# Patient Record
Sex: Male | Born: 1965 | Race: White | Hispanic: No | Marital: Married | Smoking: Never smoker
Health system: Southern US, Community
[De-identification: ages and names within clinical notes are randomized; demographics above are authoritative.]

---

## 2017-05-28 ENCOUNTER — Emergency Department (HOSPITAL_COMMUNITY)
Admission: EM | Admit: 2017-05-28 | Discharge: 2017-05-28 | Disposition: A | Discharge status: Home / Self Care | Payer: Self-pay | Attending: Emergency Medicine | Admitting: Emergency Medicine

## 2017-05-28 ENCOUNTER — Emergency Department (HOSPITAL_COMMUNITY): Payer: Self-pay

## 2017-05-28 ENCOUNTER — Encounter (HOSPITAL_COMMUNITY): Payer: Self-pay | Admitting: Emergency Medicine

## 2017-05-28 DIAGNOSIS — Y929 Unspecified place or not applicable: Secondary | ICD-10-CM | POA: Insufficient documentation

## 2017-05-28 DIAGNOSIS — Y939 Activity, unspecified: Secondary | ICD-10-CM | POA: Insufficient documentation

## 2017-05-28 DIAGNOSIS — Y998 Other external cause status: Secondary | ICD-10-CM | POA: Insufficient documentation

## 2017-05-28 DIAGNOSIS — W1789XA Other fall from one level to another, initial encounter: Secondary | ICD-10-CM | POA: Insufficient documentation

## 2017-05-28 DIAGNOSIS — Z23 Encounter for immunization: Secondary | ICD-10-CM | POA: Insufficient documentation

## 2017-05-28 DIAGNOSIS — S0101XA Laceration without foreign body of scalp, initial encounter: Secondary | ICD-10-CM | POA: Insufficient documentation

## 2017-05-28 DIAGNOSIS — S42114A Nondisplaced fracture of body of scapula, right shoulder, initial encounter for closed fracture: Secondary | ICD-10-CM | POA: Insufficient documentation

## 2017-05-28 MED ORDER — IOPAMIDOL (ISOVUE-300) INJECTION 61%
INTRAVENOUS | Status: AC
  Administered 2017-05-28: 75 mL via INTRAVENOUS
  Filled 2017-05-28: qty 75

## 2017-05-28 MED ORDER — TETANUS-DIPHTH-ACELL PERTUSSIS 5-2.5-18.5 LF-MCG/0.5 IM SUSP
0.5000 mL | Freq: Once | INTRAMUSCULAR | Status: AC
  Administered 2017-05-28: 0.5 mL via INTRAMUSCULAR
  Filled 2017-05-28: qty 0.5

## 2017-05-28 MED ORDER — OXYCODONE-ACETAMINOPHEN 5-325 MG PO TABS: 1.0000 | ORAL_TABLET | Freq: Four times a day (QID) | ORAL | 0 refills | Status: DC | PRN

## 2017-05-28 MED ORDER — SODIUM CHLORIDE 0.9 % IV SOLN: Freq: Once | INTRAVENOUS | Status: DC

## 2017-05-28 MED ORDER — MORPHINE SULFATE (PF) 4 MG/ML IV SOLN: 4.0000 mg | Freq: Once | INTRAVENOUS | Status: DC | PRN

## 2017-05-28 NOTE — ED Notes (Signed)
Pt given warm wet cloths to wash his face and side of head.

## 2017-05-28 NOTE — Progress Notes (Signed)
Orthopedic Tech Progress Note Patient Details:  Hunter Beck 23-Mar-1966 [id number]  Ortho Devices Type of Ortho Device: Shoulder immobilizer Ortho Device/Splint Location: RUE Ortho Device/Splint Interventions: Ordered, Application   Jennye MoccasinHughes, Hunter Beck 05/28/2017, 6:11 PM

## 2017-05-28 NOTE — ED Provider Notes (Signed)
[facility]-EMERGENCY DEPT Provider Note   CSN: [id number] Arrival date & time: 05/28/17  1430     History   Chief Complaint Chief Complaint  Patient presents with  . Fall  . Head Injury    HPI Hunter Beck is a 51 y.o. male.  HPI   51 year old male presenting after fall. He fell from the back of a truck approximately 3 feet from the ground. He lost his balance. He fell backwards and onto his right side. He did strike his head. Is unsure if he lost consciousness. Complaining of a headache and sustained scalp laceration. He is also having significant right posterior shoulder pain. Denies any neck or back pain. He is not anticoagulated. Denies any acute numbness, tingling or focal loss of strength. Accident happened shortly before arrival. He is unsure of his last tetanus shot. He is currently declining pain medication.  History reviewed. No pertinent past medical history.  There are no active problems to display for this patient.   History reviewed. No pertinent surgical history.     Home Medications    Prior to Admission medications   Not on File    Family History No family history on file.  Social History Social History  Substance Use Topics  . Smoking status: Never Smoker  . Smokeless tobacco: Never Used  . Alcohol use Yes     Comment: occasionally     Allergies   Patient has no known allergies.   Review of Systems Review of Systems  All systems reviewed and negative, other than as noted in HPI.  Physical Exam Updated Vital Signs BP (!) 164/109   Pulse 74   Temp (!) 97.2 F (36.2 C) (Temporal)   Resp 16   Ht  (1.702 m)   Wt 83.9 kg (185 lb)   SpO2 95%   BMI 28.98 kg/m   Physical Exam  Constitutional: He is oriented to person, place, and time. He appears well-developed and well-nourished. No distress.  HENT:  Head: Normocephalic.  Two R posterior scalp lacerations. 2.5 cm and 4 cm. no active bleeding.   Eyes: Pupils are equal, round, and  reactive to light. Conjunctivae and EOM are normal. Right eye exhibits no discharge. Left eye exhibits no discharge.  Neck: Neck supple.  Cardiovascular: Normal rate, regular rhythm and normal heart sounds.  Exam reveals no gallop and no friction rub.   No murmur heard. Pulmonary/Chest: Effort normal and breath sounds normal. No respiratory distress.  Abdominal: Soft. He exhibits no distension. There is no tenderness.  Musculoskeletal: He exhibits no edema or tenderness.  Significant R posterior shoulder/scapular tenderness. Mild soft tissue swelling. Internal and external rotation of shoulder w/o pain. Pain posteriorly with abduction. No midline spinal tenderness  Neurological: He is alert and oriented to person, place, and time. No cranial nerve deficit or sensory deficit. He exhibits normal muscle tone. Coordination normal.  Skin: Skin is warm and dry.  Psychiatric: He has a normal mood and affect. His behavior is normal. Thought content normal.  Nursing note and vitals reviewed.    ED Treatments / Results  Labs (all labs ordered are listed, but only abnormal results are displayed) Labs Reviewed - No data to display  EKG  EKG Interpretation None       Radiology Ct Head Wo Contrast  Result Date: 05/28/2017 CLINICAL DATA:  Larey Seat off the back of a truck. EXAM: CT HEAD WITHOUT CONTRAST CT CERVICAL SPINE WITHOUT CONTRAST TECHNIQUE: Multidetector CT imaging of the head and cervical  spine was performed following the standard protocol without intravenous contrast. Multiplanar CT image reconstructions of the cervical spine were also generated. COMPARISON:  None. FINDINGS: CT HEAD FINDINGS Brain: No evidence of acute infarction, hemorrhage, hydrocephalus, extra-axial collection or mass lesion/mass effect. Mild age related cerebral atrophy with prominent ex vacuo dilatation of the ventricles, greater than expected for age. Vascular: No hyperdense vessel or unexpected calcification. Skull:  Normal. Negative for fracture or focal lesion. Sinuses/Orbits: The bilateral paranasal sinuses and mastoid air cells are clear. The orbits are unremarkable. Other: Small right parietal scalp hematoma with overlying skin staples. CT CERVICAL SPINE FINDINGS Alignment: Normal. Skull base and vertebrae: No acute fracture. No primary bone lesion or focal pathologic process. Soft tissues and spinal canal: No prevertebral fluid or swelling. No visible canal hematoma. Disc levels: Multilevel disc height loss, with prominent disc bulges at C4-C5, C5-C6, and C6-C7. Moderate left facet uncovertebral hypertrophy at C3-C4 with resultant moderate neuroforaminal stenosis. Moderate left facet arthropathy at C4-C5. Moderate disc disease and uncovertebral hypertrophy at C5-C6 resulting in moderate bilateral neuroforaminal stenosis. Upper chest: Negative. Other: None. IMPRESSION: 1. No acute intracranial abnormality. Small right parietal scalp hematoma. 2.  No acute cervical spine fracture. 3. 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 Electronically Signed   By: Obie DredgeWilliam T Derry M.D.   On: 05/28/2017 16:19   Ct Chest W Contrast  Addendum Date: 05/28/2017   ADDENDUM REPORT: 05/28/2017 16:50 ADDENDUM: After discussion with Dr. Juleen ChinaKohut regarding the exact site of the patient's pain, and upon further review of the CT images, there is a nondisplaced fracture of the scapular body. No associated hematoma or overlying soft tissue stranding. Electronically Signed   By: Leanna BattlesMelinda  Blietz M.D.   On: 05/28/2017 16:50   Result Date: 05/28/2017 CLINICAL DATA:  Fall, initial encounter. EXAM: CT CHEST WITH CONTRAST TECHNIQUE: Multidetector CT imaging of the chest was performed during intravenous contrast administration. CONTRAST:  75mL ISOVUE-300 IOPAMIDOL (ISOVUE-300) INJECTION 61% COMPARISON:  None. FINDINGS: Cardiovascular: Vascular structures are unremarkable. Heart size normal. No pericardial  effusion. Mediastinum/Nodes: No pathologically enlarged mediastinal, hilar or axillary lymph nodes. Esophagus is grossly unremarkable. Lungs/Pleura: Mild dependent atelectasis bilaterally. Lungs are otherwise clear. No pleural fluid. Airway is unremarkable. Upper Abdomen: Visualized portions of the liver, gallbladder, adrenal glands, kidneys, spleen, pancreas, stomach and bowel are grossly unremarkable. No upper abdominal adenopathy. Musculoskeletal: No fracture. 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 Dextroconvex scoliosis centered in the midthoracic spine. IMPRESSION: No evidence of acute trauma. Electronically Signed: By: Leanna BattlesMelinda  Blietz M.D. On: 05/28/2017 16:13   Ct Cervical Spine Wo Contrast  Result Date: 05/28/2017 CLINICAL DATA:  Larey SeatFell off the back of a truck. EXAM: CT HEAD WITHOUT CONTRAST CT CERVICAL SPINE WITHOUT CONTRAST TECHNIQUE: Multidetector CT imaging of the head and cervical spine was performed following the standard protocol without intravenous contrast. Multiplanar CT image reconstructions of the cervical spine were also generated. COMPARISON:  None. FINDINGS: CT HEAD FINDINGS Brain: No evidence of acute infarction, hemorrhage, hydrocephalus, extra-axial collection or mass lesion/mass effect. Mild age related cerebral atrophy with prominent ex vacuo dilatation of the ventricles, greater than expected for age. Vascular: No hyperdense vessel or unexpected calcification. Skull: Normal. Negative for fracture or focal lesion. Sinuses/Orbits: The bilateral paranasal sinuses and mastoid air cells are clear. The orbits are unremarkable. Other: Small right parietal scalp hematoma with overlying skin staples. CT CERVICAL SPINE FINDINGS Alignment: Normal. Skull base and vertebrae: No acute fracture. No primary bone lesion or focal pathologic process. Soft tissues  and spinal canal: No prevertebral fluid or swelling. No visible canal hematoma. Disc levels: Multilevel disc height loss, with prominent disc bulges at  C4-C5, C5-C6, and C6-C7. Moderate left facet uncovertebral hypertrophy at C3-C4 with resultant moderate neuroforaminal stenosis. Moderate left facet arthropathy at C4-C5. Moderate disc disease and uncovertebral hypertrophy at C5-C6 resulting in moderate bilateral neuroforaminal stenosis. Upper chest: Negative. Other: None. IMPRESSION: 1. No acute intracranial abnormality. Small right parietal scalp hematoma. 2.  No acute cervical spine fracture. 3. 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 Electronically Signed   By: Obie Dredge M.D.   On: 05/28/2017 16:19    Procedures Procedures (including critical care time)  LACERATION REPAIR Performed by: Raeford Razor Authorized by: Raeford Razor Consent: Verbal consent obtained. Risks and benefits: risks, benefits and alternatives were discussed Consent given by: patient Patient identity confirmed: provided demographic data Prepped and Draped in normal sterile fashion Wound explored  Laceration Location: scalp  Laceration Length: 2.5 cm  No Foreign Bodies seen or palpated  Anesthesia: none  Irrigation method: syringe Amount of cleaning: standard  Skin closure: stapled  Number of staples: 2  Patient tolerance: Patient tolerated the procedure well with no immediate complications.  LACERATION REPAIR Performed by: Raeford Razor Authorized by: Raeford Razor Consent: Verbal consent obtained. Risks and benefits: risks, benefits and alternatives were discussed Consent given by: patient Patient identity confirmed: provided demographic data Prepped and Draped in normal sterile fashion Wound explored  Laceration Location: scalp  Laceration Length: 4 cm  No Foreign Bodies seen or palpated  Anesthesia: none  Irrigation method: syringe Amount of cleaning: standard  Skin closure: stapled  Number of staples: 6  Patient tolerance: Patient tolerated the procedure well with no  immediate complications.  Medications Ordered in ED Medications  Tdap (BOOSTRIX) injection 0.5 mL (not administered)  0.9 %  sodium chloride infusion (not administered)  morphine 4 MG/ML injection 4 mg (not administered)  iopamidol (ISOVUE-300) 61 % injection (75 mLs Intravenous Contrast Given 05/28/17 1544)     Initial Impression / Assessment and Plan / ED Course  I have reviewed the triage vital signs and the nursing notes.  Pertinent labs & imaging results that were available during my care of the patient were reviewed by me and considered in my medical decision making (see chart for details).     51 year old male presenting after fall. 2 posterior scalp lacerations which were stapled. Neurologically he is intact. He is not anticoagulated. He is not complaining of any neck pain nor does have midline spinal tenderness but I am concerned for possible distracting injury. He does has significant right posterior shoulder/scapular tenderness. The glenohumeral joint and clavicle seem fine. Will CT his head and cervical spine. I am concerned for scapular injury which is typically associated with other significant traumatic injuries so will also CT chest.   Possible nondisplaced R scapular fracture. Subtle radiographically if actually real.  He is exquisitely tender in this area though. Will place in sling. Ortho FU. PRN pain meds. Wound care. Staple removal in 7-10 days.    Final Clinical Impressions(s) / ED Diagnoses   Final diagnoses:  Laceration of scalp, initial encounter  Closed nondisplaced fracture of body of right scapula, initial encounter    New Prescriptions New Prescriptions   No medications on file     Raeford Razor, MD 06/15/17 1218

## 2017-06-07 ENCOUNTER — Encounter: Payer: Self-pay | Admitting: Family Medicine

## 2017-06-07 ENCOUNTER — Ambulatory Visit (INDEPENDENT_AMBULATORY_CARE_PROVIDER_SITE_OTHER): Payer: Worker's Compensation | Admitting: Family Medicine

## 2017-06-07 VITALS — BP 137/88 | HR 65 | Temp 97.6°F | Ht 67.0 in | Wt 181.0 lb

## 2017-06-07 DIAGNOSIS — S0101XA Laceration without foreign body of scalp, initial encounter: Secondary | ICD-10-CM

## 2017-06-07 DIAGNOSIS — W19XXXD Unspecified fall, subsequent encounter: Secondary | ICD-10-CM

## 2017-06-07 DIAGNOSIS — Z4802 Encounter for removal of sutures: Secondary | ICD-10-CM

## 2017-06-07 NOTE — Progress Notes (Signed)
   Subjective: CC: Work injury Date of injury: 05/28/2017 Employer: [employer] PCP: Janora Norlander, DO Hunter Beck is a 51 y.o. male presenting to clinic today for:  1. Fall Patient seen in the emergency department on 05/28/2017 for a fall from 3 feet high. He did sustain injury to his head and was unsure loss of consciousness. He had a CT scan of his head which was negative for acute fractures or intracranial bleed. Additionally, he was complaining of right shoulder/posterior shoulder pain. CT chest showed what looked to be a subtle nondisplaced fracture of the right scapula. He was referred to orthopedics for further evaluation of this. For his scalp lacerations, he had 8 total staples placed, which he reportedly tolerated well. He also had his tetanus shot booster administered. He was discharged with a small supply of Percocet for pain. He notes since emergency department visit, he has been doing well.  He notes has has no more right shoulder pain.  He has full use of shoulder.  Did not see orthopedics. no Purulence, bleeding, swelling, fevers, nausea, vomiting, redness. No Dizziness, blurred vision, slurred speech, weakness, numbness or tingling.  No Known Allergies History reviewed. No pertinent past medical history. Family History  Problem Relation Age of Onset  . Cancer Mother   . Arthritis Mother    Social Hx: non smoker.Current medications reviewed.   ROS: Per HPI  Objective: Office vital signs reviewed. BP 137/88   Pulse 65   Temp 97.6 F (36.4 C) (Oral)   Ht '5\' 7"'$  (1.702 m)   Wt 181 lb (82.1 kg)   BMI 28.35 kg/m   Physical Examination:  General: Awake, alert, well nourished, well appearing, No acute distress HEENT: EOMI, PERRL, FROM of c-spine. Scalp with 2 lacerations and a total of 8 staples identified.  Some scab formation present.  No drainage, purulence, erythema, bleeding or TTP. Extremities: Has full, painless active range of motion of bilateral  upper extremities. Extremities are warm, well perfused, No edema, cyanosis or clubbing; +2 pulses bilaterally  PROCEDURE: Verbal consent was obtained from patient prior to procedure. Area was cleaned with rubbing alcohol. Staples were identified, and removed using a staple removal kit. No bleeding. No immediate consultations. Patient tolerated procedure well. Home care instructions were reviewed with patient.  Assessment/ Plan: 51 y.o. male   1. Laceration of scalp without foreign body, initial encounter No evidence of infection, bleeding.  2. Encounter for staple removal See procedure note above. Tolerated procedure well. Home care charges were reviewed including avoidance of swimming pools/ lakes for the next couple of days. He may continue to bathe normally. Resume normal activities.  3. Fall, subsequent encounter Patient did not seek treatment with the orthopedist for suspected right scapular fracture. He notes complete resolution of symptoms and therefore does not desire to pursue evaluation. He has been working unrestricted since fall. He may continue activities as tolerated. I did discuss that if he does have a true fracture, he risks displacing the fracture which would require surgical repair. He was good understanding of this and except these risks. Return precautions discussed. He will follow up as needed. No work restrictions placed.   Janora Norlander, DO [place] [phone]

## 2017-06-07 NOTE — Patient Instructions (Signed)
Make sure to see the orthopedist as recommended for your fracture of your shoulder blade.

## 2018-09-17 IMAGING — CT CT CHEST W/ CM
2 of 4 series · 15 of 36 positions shown, 18 images · IV contrast (Omni 300)
Comparison: None.

ADDENDUM:
After discussion with Dr. Leftheris regarding the exact site of the
patient's pain, and upon further review of the CT images, there is a
nondisplaced fracture of the scapular body. No associated hematoma
or overlying soft tissue stranding.
CLINICAL DATA: Fall, initial encounter.

EXAM:
CT CHEST WITH CONTRAST
TECHNIQUE: Multidetector CT imaging of the chest was performed during
intravenous contrast administration.
CONTRAST:  75mL NEHZQJ-T99 IOPAMIDOL (NEHZQJ-T99) INJECTION 61%

[Series 3: chest with 2mm st · axial · 0.88mm/px · z∈[-522,-260]mm · 12 of 153 slices shown, 15 images]
[im 11/153  mediastinal]
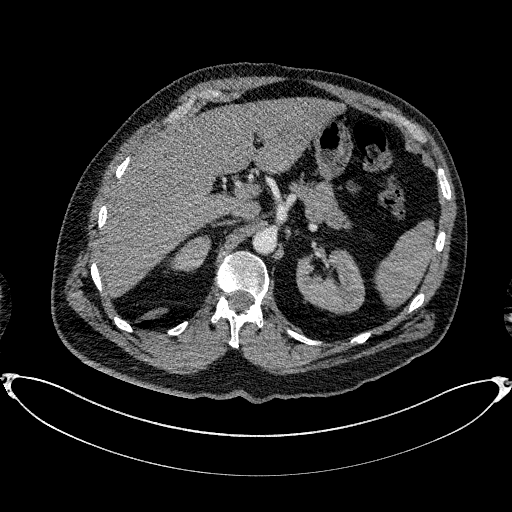
[im 11/153  lung]
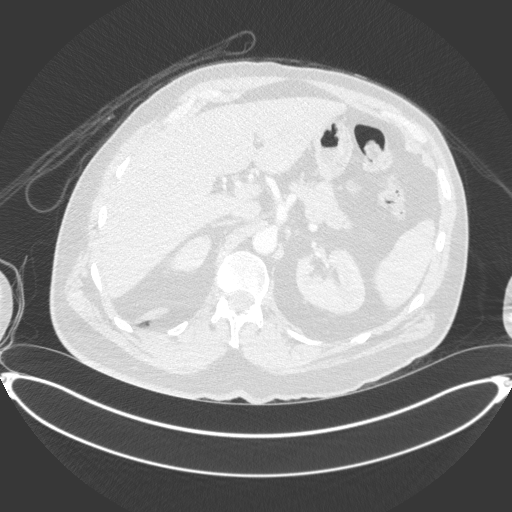
[im 22/153  lung]
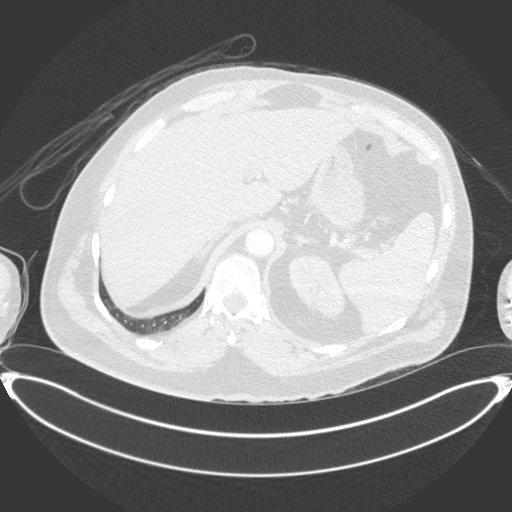
[im 33/153  lung]
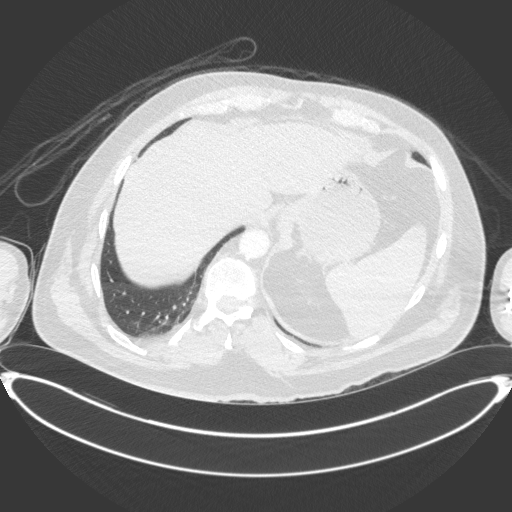
[im 44/153  lung]
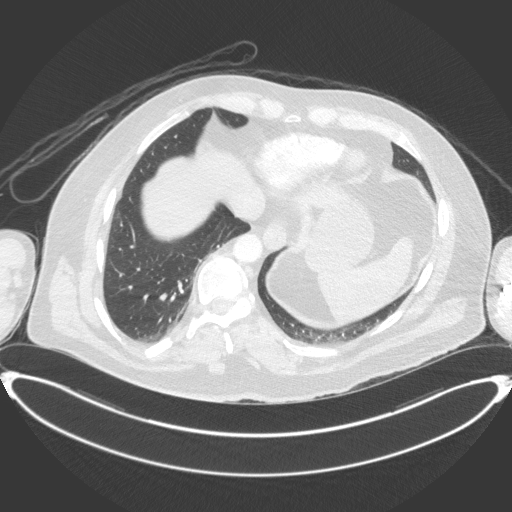
[im 55/153  mediastinal]
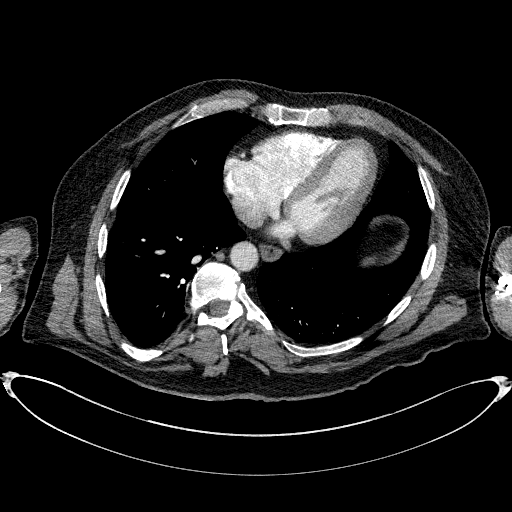
[im 55/153  lung]
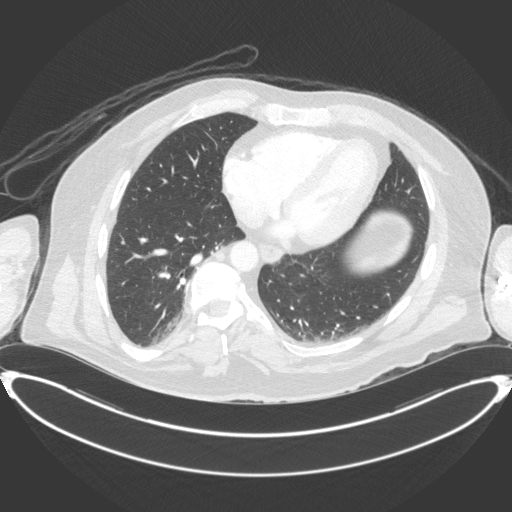
[im 66/153  lung]
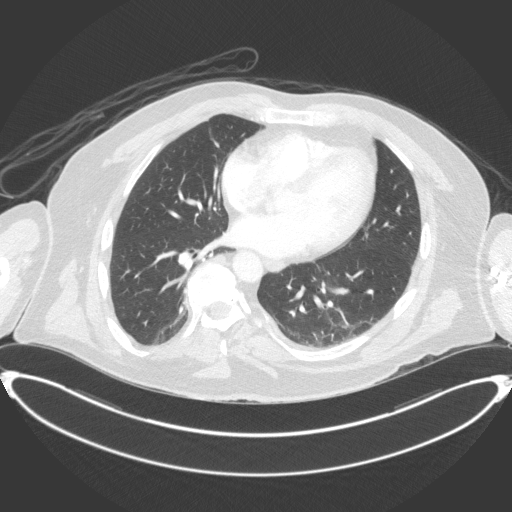
[im 87/153  lung]
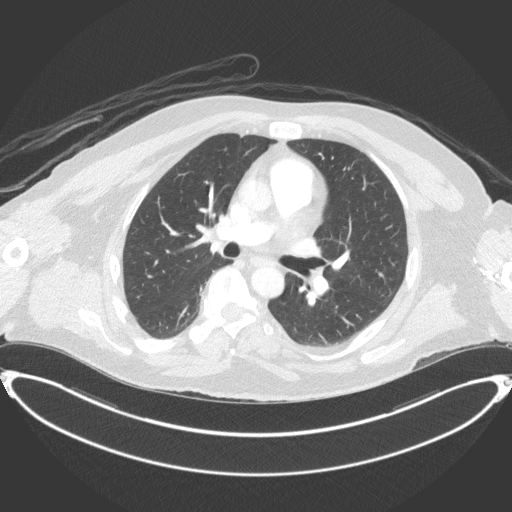
[im 98/153  lung]
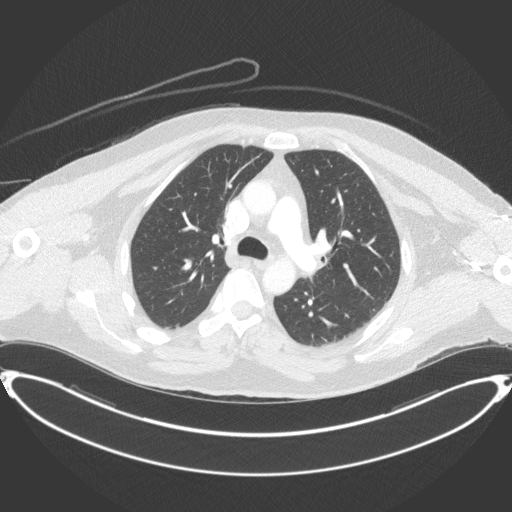
[im 109/153  mediastinal]
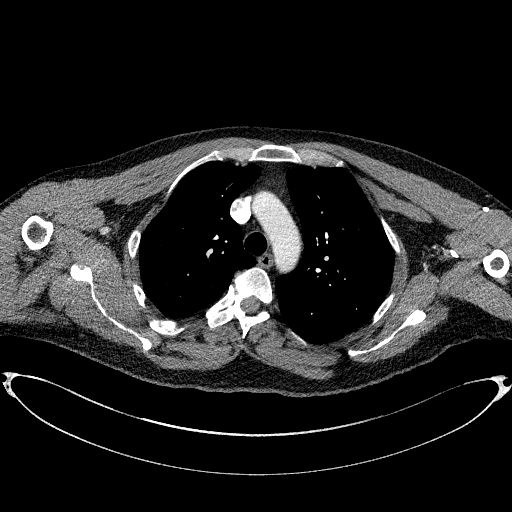
[im 109/153  lung]
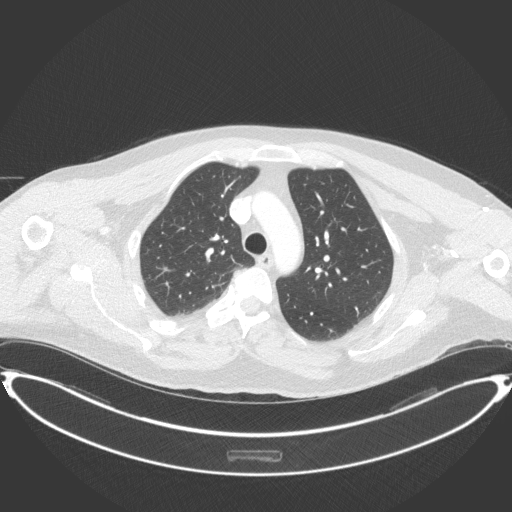
[im 120/153  lung]
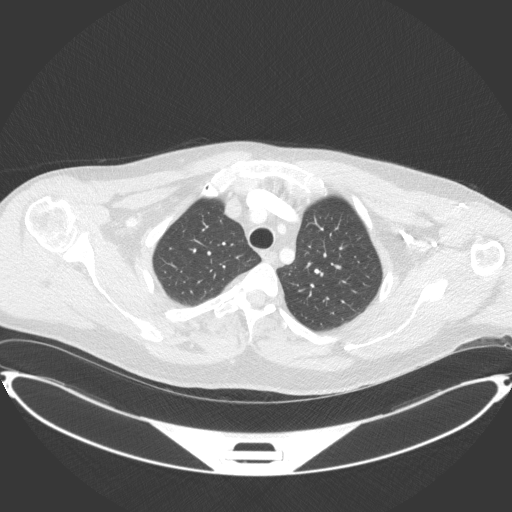
[im 131/153  lung]
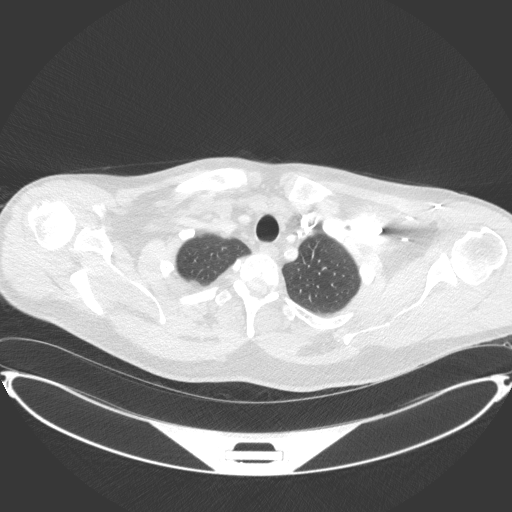
[im 142/153  lung]
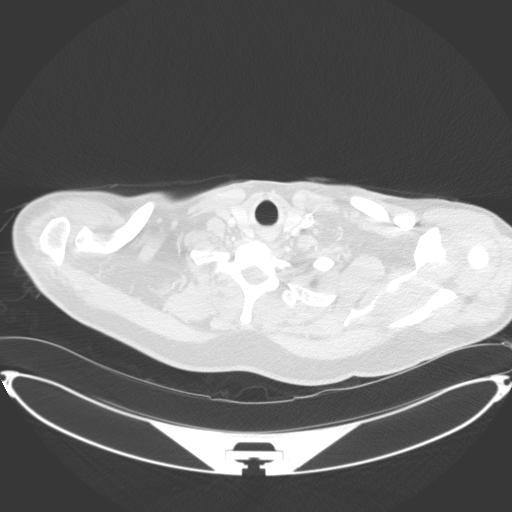

[Series 5: chest with 3mm st cor · coronal · 0.60mm/px · 3 of 101 slices shown]
[im 21/101  lung]
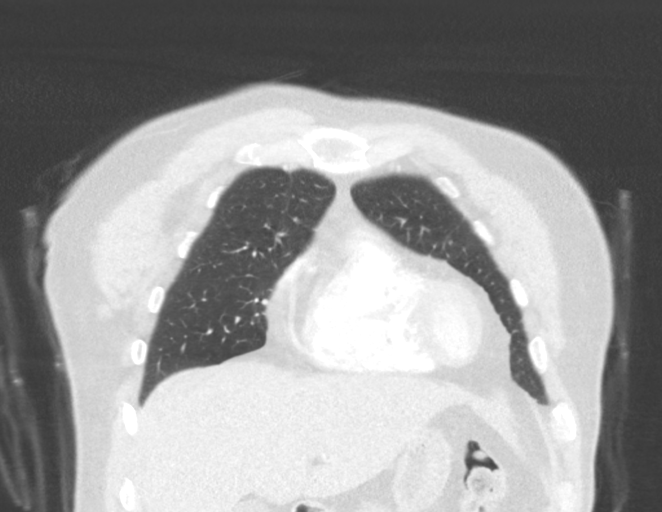
[im 41/101  lung]
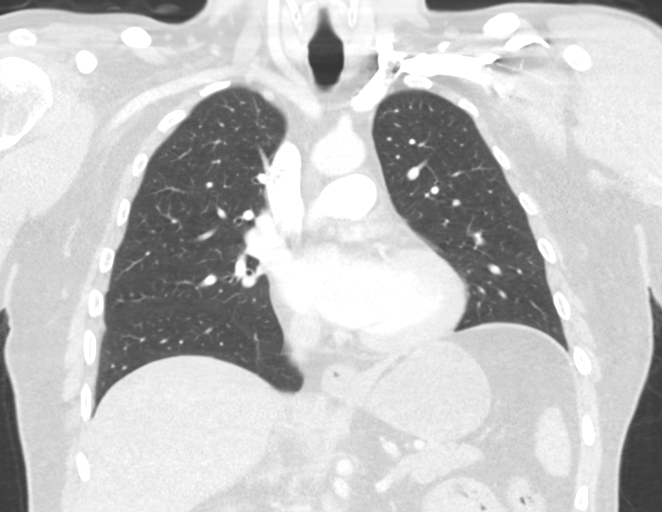
[im 61/101  lung]
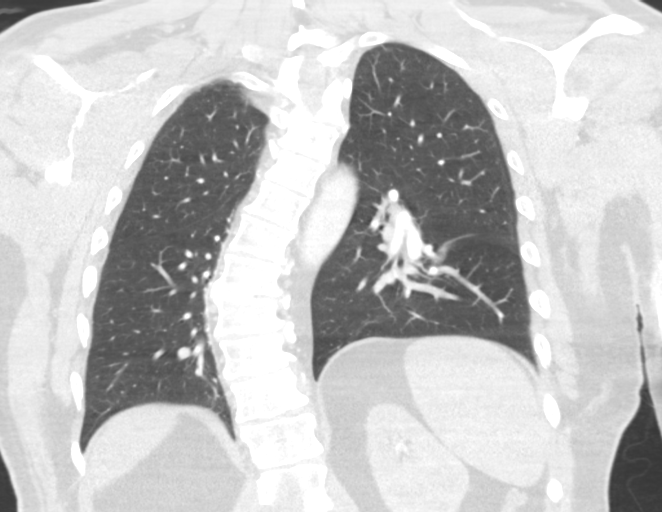

[15 of 36 positions shown; findings below may reference images not displayed]

FINDINGS: Cardiovascular: Vascular structures are unremarkable. Heart size
normal. No pericardial effusion.

Mediastinum/Nodes: No pathologically enlarged mediastinal, hilar or
axillary lymph nodes. Esophagus is grossly unremarkable.

Lungs/Pleura: Mild dependent atelectasis bilaterally. Lungs are
otherwise clear. No pleural fluid. Airway is unremarkable.

Upper Abdomen: Visualized portions of the liver, gallbladder,
adrenal glands, kidneys, spleen, pancreas, stomach and bowel are
grossly unremarkable. No upper abdominal adenopathy.

Musculoskeletal: No fracture. Os acromiale on the right.
Dextroconvex scoliosis centered in the midthoracic spine.
IMPRESSION: No evidence of acute trauma.

## 2018-09-17 IMAGING — CT CT CERVICAL SPINE W/O CM
5 of 8 series · 11 of 33 positions shown, 12 images · non-contrast
Comparison: None.

CLINICAL DATA: Fell off the back of a truck.

EXAM:
CT HEAD WITHOUT CONTRAST
CT CERVICAL SPINE WITHOUT CONTRAST
TECHNIQUE: Multidetector CT imaging of the head and cervical spine was
performed following the standard protocol without intravenous
contrast. Multiplanar CT image reconstructions of the cervical spine
were also generated.

[Series 5: head bone · axial · 0.45mm/px · z∈[-68,-12]mm · 2 of 86 slices shown]
[im 29/86  bone]
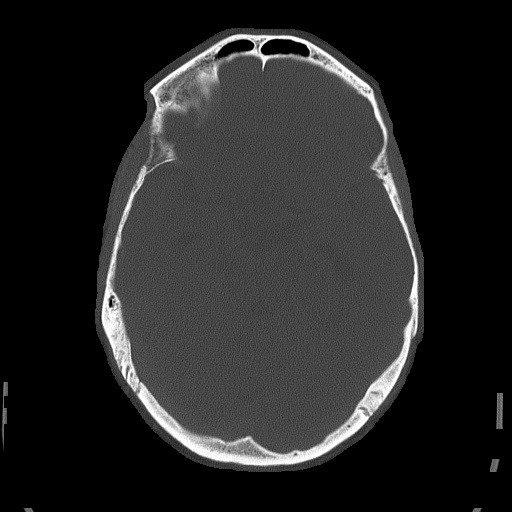
[im 57/86  bone]
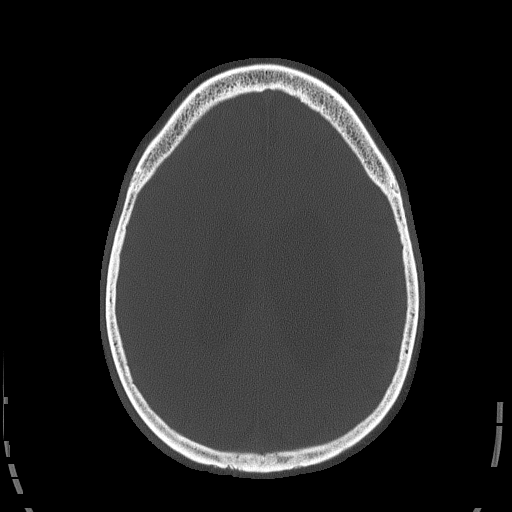

[Series 8: c_spine 2.0 st · axial · 0.32mm/px · z∈[-228,-166]mm · 2 of 93 slices shown, 3 images]
[im 31/93  soft-tissue]
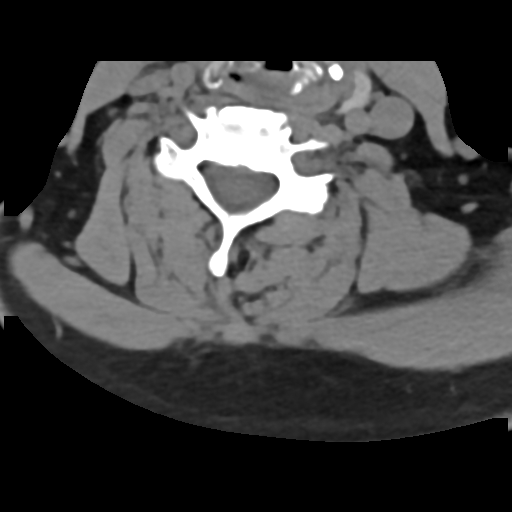
[im 31/93  bone]
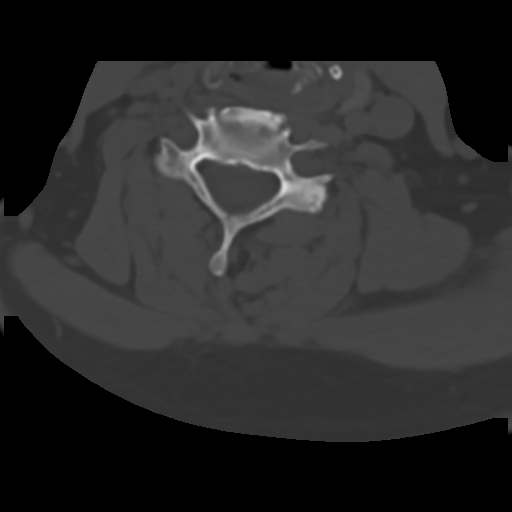
[im 62/93  bone]
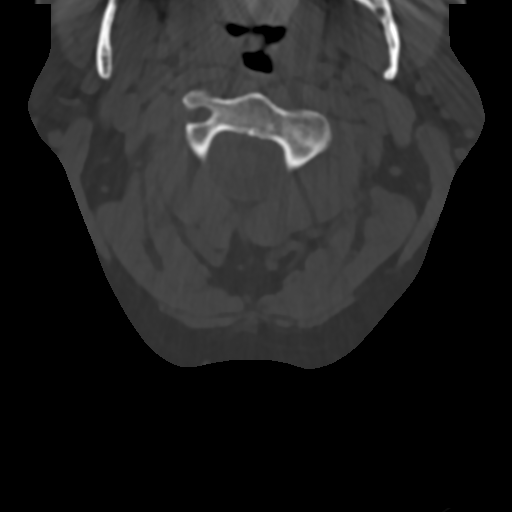

[Series 10: c_spine 2.0 sag bone · sagittal · 0.27mm/px · 4 of 61 slices shown]
[im 13/61  bone]
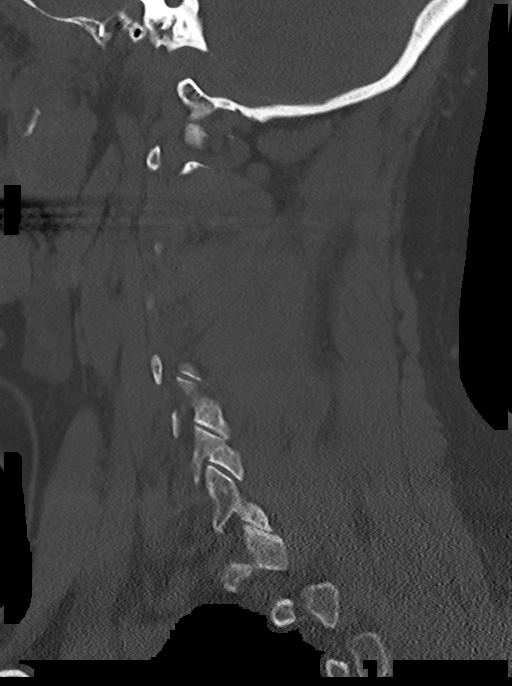
[im 25/61  bone]
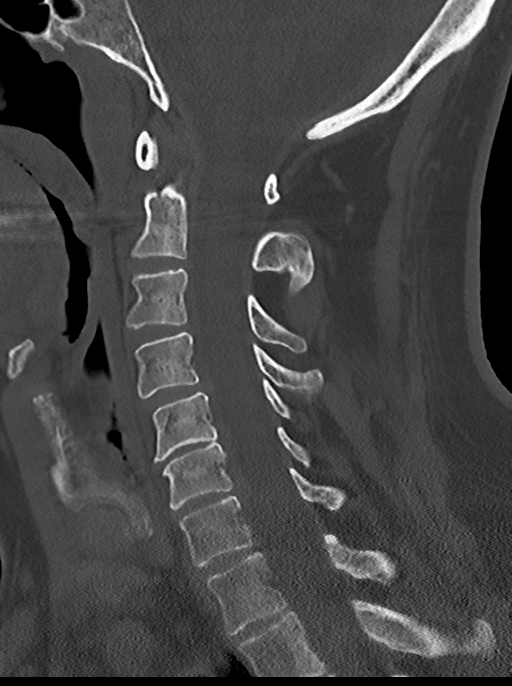
[im 37/61  bone]
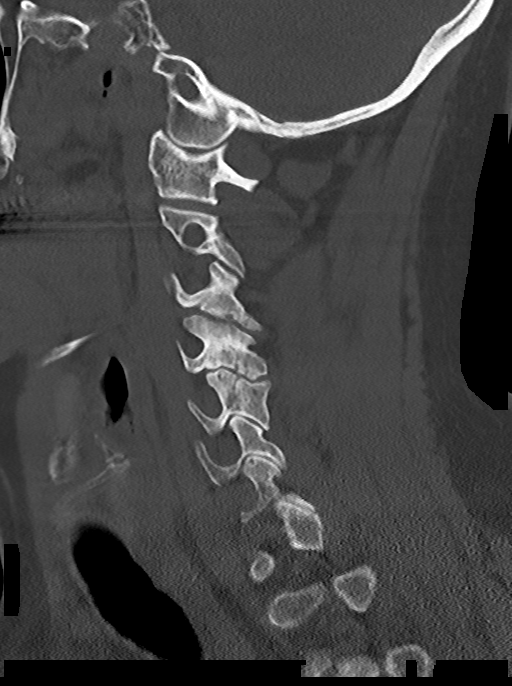
[im 49/61  bone]
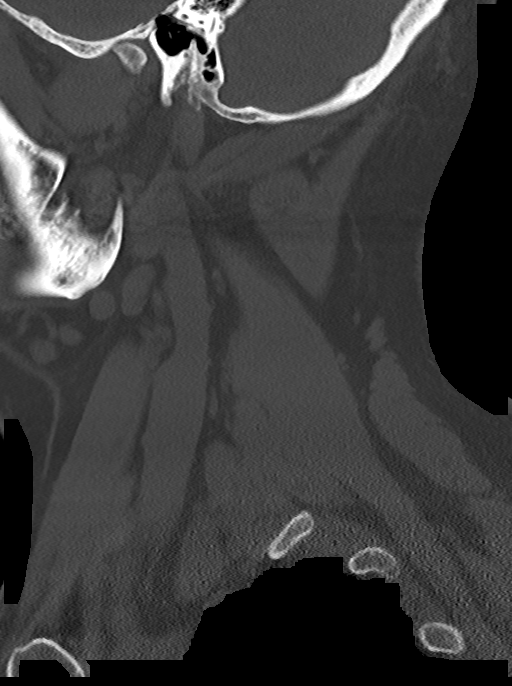

[Series 11: c_spine 2.0 cor bone · coronal · 0.27mm/px · 1 of 61 slices shown]
[im 31/61  bone]
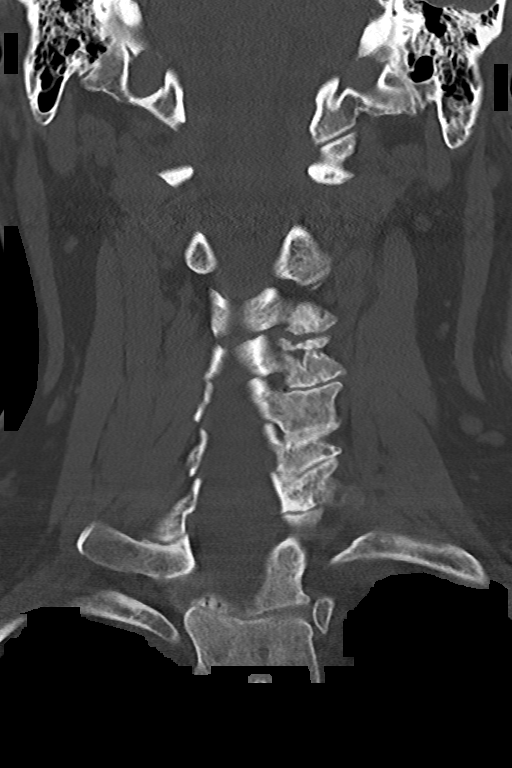

[Series 12: c_spine 2.0 orthogonals · axial · 0.21mm/px · z∈[-250,-187]mm · 2 of 89 slices shown]
[im 30/89  bone]
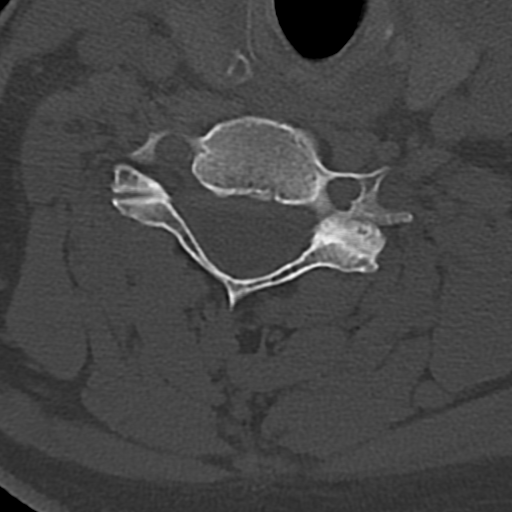
[im 59/89  bone]
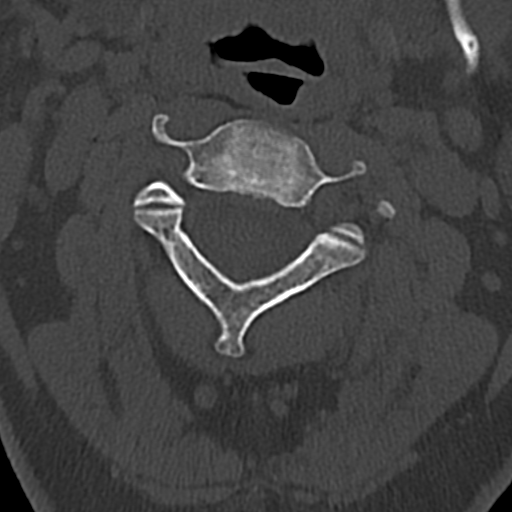

[11 of 33 positions shown; findings below may reference images not displayed]

FINDINGS: CT HEAD FINDINGS

Brain: No evidence of acute infarction, hemorrhage, hydrocephalus,
extra-axial collection or mass lesion/mass effect. Mild age related
cerebral atrophy with prominent ex vacuo dilatation of the
ventricles, greater than expected for age.

Vascular: No hyperdense vessel or unexpected calcification.

Skull: Normal. Negative for fracture or focal lesion.

Sinuses/Orbits: The bilateral paranasal sinuses and mastoid air
cells are clear. The orbits are unremarkable.

Other: Small right parietal scalp hematoma with overlying skin
staples.

CT CERVICAL SPINE FINDINGS

Alignment: Normal.

Skull base and vertebrae: No acute fracture. No primary bone lesion
or focal pathologic process.

Soft tissues and spinal canal: No prevertebral fluid or swelling. No
visible canal hematoma.

Disc levels: Multilevel disc height loss, with prominent disc bulges
at C4-C5, C5-C6, and C6-C7. Moderate left facet uncovertebral
hypertrophy at C3-C4 with resultant moderate neuroforaminal
stenosis. Moderate left facet arthropathy at C4-C5. Moderate disc
disease and uncovertebral hypertrophy at C5-C6 resulting in moderate
bilateral neuroforaminal stenosis.

Upper chest: Negative.

Other: None.
IMPRESSION: 1. No acute intracranial abnormality. Small right parietal scalp
hematoma.
2.  No acute cervical spine fracture.
3. Moderate multilevel degenerative changes throughout the cervical
spine as described above.
